# Patient Record
Sex: Male | Born: 1965 | Hispanic: Yes | Marital: Single | Smoking: Never smoker
Health system: Southern US, Community
[De-identification: ages and names within clinical notes are randomized; demographics above are authoritative.]

## PROBLEM LIST (undated history)

## (undated) DIAGNOSIS — A159 Respiratory tuberculosis unspecified: Secondary | ICD-10-CM

---

## 2017-10-26 ENCOUNTER — Encounter (HOSPITAL_COMMUNITY): Payer: Self-pay | Admitting: Emergency Medicine

## 2017-10-26 ENCOUNTER — Emergency Department (HOSPITAL_COMMUNITY)
Admission: EM | Admit: 2017-10-26 | Discharge: 2017-10-26 | Disposition: A | Discharge status: Home / Self Care | Payer: Self-pay | Attending: Emergency Medicine | Admitting: Emergency Medicine

## 2017-10-26 ENCOUNTER — Other Ambulatory Visit: Payer: Self-pay

## 2017-10-26 DIAGNOSIS — M5432 Sciatica, left side: Secondary | ICD-10-CM | POA: Insufficient documentation

## 2017-10-26 HISTORY — DX: Respiratory tuberculosis unspecified: A15.9

## 2017-10-26 MED ORDER — KETOROLAC TROMETHAMINE 60 MG/2ML IM SOLN
30.0000 mg | Freq: Once | INTRAMUSCULAR | Status: AC
  Administered 2017-10-26: 30 mg via INTRAMUSCULAR
  Filled 2017-10-26: qty 2

## 2017-10-26 MED ORDER — PREDNISONE 50 MG PO TABS: ORAL_TABLET | ORAL | 0 refills | Status: AC

## 2017-10-26 MED ORDER — HYDROCODONE-ACETAMINOPHEN 5-325 MG PO TABS
ORAL_TABLET | ORAL | 0 refills | Status: AC
Start: 2017-10-26 — End: ?

## 2017-10-26 NOTE — Discharge Instructions (Addendum)
Take vicodin for breakthrough pain, do not drink alcohol, drive, care for children or do other critical tasks while taking vicodin. ° °Please follow with your primary care doctor in the next 2 days for a check-up. They must obtain records for further management.  ° °Do not hesitate to return to the Emergency Department for any new, worsening or concerning symptoms.  ° °

## 2017-10-26 NOTE — ED Provider Notes (Signed)
[facility]-EMERGENCY DEPT Provider Note   CSN: [id number] Arrival date & time: 10/26/17  0417     History   Chief Complaint Chief Complaint  Patient presents with  . Sciatica     HPI   Blood pressure 121/86, pulse 91, temperature 98.5 F (36.9 C), resp. rate 16, SpO2 96 %.  [facility] David Jenkins is a 52 y.o. male complaining of low back pain radiating down the left leg, worsening over the course of the last several days, kept him from sleep last night.  He is taking acetaminophen at home with little relief. Denies fever, chills, change in bowel or bladder habits, h/o IDVU or cancer, numbness or weakness.    Past Medical History:  Diagnosis Date  . Tuberculosis     There are no active problems to display for this patient.   History reviewed. No pertinent surgical history.     Home Medications    Prior to Admission medications   Medication Sig Start Date End Date Taking? Authorizing Provider  HYDROcodone-acetaminophen (NORCO/VICODIN) 5-325 MG tablet Take 1-2 tablets by mouth every 6 hours as needed for pain and/or cough. 10/26/17   Attikus Bartoszek, Joni Reining, PA-C  predniSONE (DELTASONE) 50 MG tablet Take 1 tablet daily with breakfast 10/26/17   Wanda Rideout, Joni Reining, PA-C    Family History Family History  Problem Relation Age of Onset  . Diabetes Father     Social History Social History   Tobacco Use  . Smoking status: Never Smoker  . Smokeless tobacco: Never Used  Substance Use Topics  . Alcohol use: Yes    Comment: occ   . Drug use: No     Allergies   Patient has no known allergies.   Review of Systems Review of Systems  A complete review of systems was obtained and all systems are negative except as noted in the HPI and PMH.   Physical Exam Updated Vital Signs BP 121/86   Pulse 91   Temp 98.5 F (36.9 C)   Resp 16   SpO2 96%   Physical Exam  Constitutional: He appears well-developed and well-nourished.  HENT:  Head:  Normocephalic.  Eyes: Conjunctivae are normal.  Neck: Normal range of motion.  Cardiovascular: Normal rate, regular rhythm and intact distal pulses.  Pulmonary/Chest: Effort normal.  Abdominal: Soft. There is no tenderness.  Neurological: He is alert.  No point tenderness to percussion of lumbar spinal processes.  No TTP or paraspinal muscular spasm. Strength is 5 out of 5 to bilateral lower extremities at hip and knee; extensor hallucis longus 5 out of 5. Ankle strength 5 out of 5, no clonus, neurovascularly intact. No saddle anaesthesia. Patellar reflexes are 2+ bilaterally.    Straight leg raise is positive on the ipsilateral (left) side at approximately 30 degrees.  Negative on the contralateral side   Psychiatric: He has a normal mood and affect.  Nursing note and vitals reviewed.    ED Treatments / Results  Labs (all labs ordered are listed, but only abnormal results are displayed) Labs Reviewed - No data to display  EKG  EKG Interpretation None       Radiology No results found.  Procedures Procedures (including critical care time)  Medications Ordered in ED Medications  ketorolac (TORADOL) injection 30 mg (30 mg Intramuscular Given 10/26/17 9629)     Initial Impression / Assessment and Plan / ED Course  I have reviewed the triage vital signs and the nursing notes.  Pertinent labs & imaging results  that were available during my care of the patient were reviewed by me and considered in my medical decision making (see chart for details).     Vitals:   10/26/17 0443 10/26/17 0632  BP: 133/85 121/86  Pulse: 90 91  Resp: 18 16  Temp: 98.5 F (36.9 C)   SpO2: 95% 96%    Medications  ketorolac (TORADOL) injection 30 mg (30 mg Intramuscular Given 10/26/17 0635)    David Jenkins is 52 y.o. male presenting with  back pain.  No neurological deficits and normal neuro exam.  Patient can walk but states is painful.  No loss of bowel or bladder control.   No concern for cauda equina.  No fever, night sweats, weight loss, h/o cancer, IVDU.  RICE protocol and pain medicine indicated and discussed with patient.  Evaluation does not show pathology that would require ongoing emergent intervention or inpatient treatment. Pt is hemodynamically stable and mentating appropriately. Discussed findings and plan with patient/guardian, who agrees with care plan. All questions answered. Return precautions discussed and outpatient follow up given.    Final Clinical Impressions(s) / ED Diagnoses   Final diagnoses:  Sciatica of left side    ED Discharge Orders        Ordered    predniSONE (DELTASONE) 50 MG tablet     10/26/17 0618    HYDROcodone-acetaminophen (NORCO/VICODIN) 5-325 MG tablet     10/26/17 0618       Zeven Kocak, Mardella Laymanicole, PA-C 10/26/17 [id number]    Derwood KaplanNanavati, Ankit, MD 10/27/17 [phone]

## 2017-10-26 NOTE — ED Triage Notes (Signed)
Pt states he has pain in his left buttock that started 30 hrs ago  Pt states it feels like nerve pain and he has not been able to sleep due to the pain  Pt states he fell 10 days ago but did not have any pain at that time

## 2021-02-15 ENCOUNTER — Telehealth: Payer: Self-pay | Admitting: Oncology

## 2021-02-15 NOTE — Telephone Encounter (Signed)
Received a new hem referral from [facility] for thrombocytopenia. Mr. David Jenkins has been scheduled to see Dr. Clelia Croft on 5/10 at 11am. Letter has been mailed to the pt and referring office has been notified of the appt date and time.

## 2021-02-26 ENCOUNTER — Inpatient Hospital Stay: Payer: 59 | Attending: Oncology | Admitting: Oncology

## 2021-04-08 ENCOUNTER — Telehealth: Payer: Self-pay | Admitting: Hematology

## 2021-04-08 NOTE — Telephone Encounter (Signed)
Received a new pt referral from [facility]. Pt has been reschedule to see Dr. Candise Che on 6/29 at 11:20am. [name] from [facility] will call the pt with the appt date and time.

## 2021-04-15 ENCOUNTER — Telehealth: Payer: Self-pay | Admitting: Hematology

## 2021-04-15 NOTE — Telephone Encounter (Signed)
Mr. David Jenkins has been rescheduled to see dr. Candise Che on 7/7 at 11am. I cld [name] at [facility] to update the pt w/the new date and time.

## 2021-04-17 ENCOUNTER — Encounter: Payer: 59 | Admitting: Hematology

## 2021-04-23 ENCOUNTER — Telehealth: Payer: Self-pay | Admitting: Hematology

## 2021-04-23 NOTE — Telephone Encounter (Signed)
Mr. David Jenkins has been rescheduled to see Dr. Candise Che on 7/13 at 1140am. I cld [name] from Dr Brandt Loosen office and provided the new appt date and time.

## 2021-04-25 ENCOUNTER — Inpatient Hospital Stay: Payer: 59 | Admitting: Hematology

## 2021-04-30 NOTE — Progress Notes (Signed)
HEMATOLOGY/ONCOLOGY CONSULTATION NOTE  Date of Service: 05/01/2021  Patient Care Team: Patient, No Pcp Per (Inactive) as PCP - General (General Practice)  CHIEF COMPLAINTS/PURPOSE OF CONSULTATION:  Thrombocytopenia  HISTORY OF PRESENTING ILLNESS:   David Jenkins is a wonderful 55 y.o. male who has been referred to [place] by Dr. Effie Shy, MD for evaluation and management of thrombocytopenia.The pt reports that he is doing well overall. We are joined today by the interpretor.  The pt reports that he is doing well. He notes that he is here due to a lump found in his neck. He first noticed an extremely small lump the size of a pimple around 12-13 years ago. This has gradually been increasing and slowly growing over this period. This does not hurt. Sometimes he squeezes it and it oozes with liquid that smells bad.   The pt notes that he is unaware of any abnormal lab readings prior in the past. He had tuberculosis in 2012/2013, but the pt notes he has been very healthy since then. He is currently not on any medications or any ongoing medical issues. He denies any surgeries in the past. He notes no allergies to medications. He does not take any OTC NSAIDs, but does take a daily Collagen and Omega 3.  He has taken a daily Centrum and Magnesium in the past. He denies any testosterone replacement or weight loss supplements. He denies any recent infections in the last six months. He drinks around 3-4 bottles water each day. He denies any history of cigarette or cigar smoking. He drinks beer and whiskey socially, around 0.5L whiskey when he drinks. At the pt's peak drinking, he would drink this amount around every other day. The pt denies any history of drug use. 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 This was in [place] at [facility].  The pt notes that  the blood in urine has resolved since he saw his PCP. He is unsure of what caused this and they told him everything was fine. They have not done the [place] yet as planned. He was urinating normally at the time, just with some blood separately after. He denies any injuries during sex or masturbation. He did not pass any stones.   Lab results 02/13/2021 of CBC w/diff and CMP is as follows: all values are WNL except for WBC of 3.6K, RBC of 5.81, Hgb of 17.2, HCT of 51.4, Plt of 89K, MPV of 13.4. 02/13/2021 Vitamin B12 of 544.  On review of systems, pt denies fevers, chills, drenching night sweats, unexpected sudden weight loss, changes in bowel habits, n/v/d, blood in urine, difficulty passing urine, abdominal pain, and any other symptoms.   MEDICAL HISTORY:  Past Medical History:  Diagnosis Date   Tuberculosis     SURGICAL HISTORY: No past surgical history on file.  SOCIAL HISTORY: Social History   Socioeconomic History   Marital status: Single    Spouse name: Not on file   Number of children: Not on file   Years of education: Not on file   Highest education level: Not on file  Occupational History   Not on file  Tobacco Use   Smoking status: Never   Smokeless tobacco: Never  Substance and Sexual Activity   Alcohol use: Yes    Comment: occ    Drug use: No  Sexual activity: Not on file  Other Topics Concern   Not on file  Social History Narrative   Not on file   Social Determinants of Health   Financial Resource Strain: Not on file  Food Insecurity: Not on file  Transportation Needs: Not on file  Physical Activity: Not on file  Stress: Not on file  Social Connections: Not on file  Intimate Partner Violence: Not on file    FAMILY HISTORY: Family History  Problem Relation Age of Onset   Diabetes Father     ALLERGIES:  has No Known Allergies.  MEDICATIONS:  Current Outpatient Medications  Medication Sig Dispense Refill   omega-3 acid ethyl esters (LOVAZA) 1 g  capsule Take 1 g by mouth 2 (two) times daily.     HYDROcodone-acetaminophen (NORCO/VICODIN) 5-325 MG tablet Take 1-2 tablets by mouth every 6 hours as needed for pain and/or cough. (Patient not taking: Reported on 05/01/2021) 7 tablet 0   predniSONE (DELTASONE) 50 MG tablet Take 1 tablet daily with breakfast (Patient not taking: Reported on 05/01/2021) 5 tablet 0   No current facility-administered medications for this visit.    REVIEW OF SYSTEMS:    10 Point review of Systems was done is negative except as noted above.  PHYSICAL EXAMINATION: ECOG PERFORMANCE STATUS: 1 - Symptomatic but completely ambulatory  . Vitals:   05/01/21 1119  BP: 135/89  Pulse: 85  Resp: 16  Temp: 97.8 F (36.6 C)  SpO2: 98%   Filed Weights   05/01/21 1119  Weight: 195 lb 9.6 oz (88.7 kg)   .Body mass index is 26.9 kg/m.  NAD. GENERAL:alert, in no acute distress and comfortable SKIN: no acute rashes, no significant lesions EYES: conjunctiva are pink and non-injected, sclera anicteric OROPHARYNX: MMM, no exudates, no oropharyngeal erythema or ulceration NECK: supple, no JVD LYMPH:  no palpable lymphadenopathy in the cervical, axillary or inguinal regions LUNGS: clear to auscultation b/l with normal respiratory effort HEART: regular rate & rhythm ABDOMEN:  normoactive bowel sounds , non tender, not distended. Extremity: no pedal edema PSYCH: alert & oriented x 3 with fluent speech NEURO: no focal motor/sensory deficits  LABORATORY DATA:  I have reviewed the data as listed  .No flowsheet data found.  .No flowsheet data found.   RADIOGRAPHIC STUDIES: I have personally reviewed the radiological images as listed and agreed with the findings in the report. No results found.  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 R/o other etiologies. 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  PLAN: -Discussed pt recent labwork; RBC and Hgb elevated, Plt lower.  -Advised pt the lump is most likely a sebaceous cyst. Would need to take the cyst out to completely get rid of this.  -Will give referral to surgeon for removal of sebaceous cyst.  -Advised pt that dehydration can cause some of the elevation in RBC/Hgb on labwork. Advised pt he needs to drink 3-4 glasses water for every glass of alcohol he drinks. -Recommended pt drink at lest 2-3L water daily. -Will look at additional blood tests to observe if any changes in the bone marrow causing these abnormalities. -Discussed Polycythemia Vera. Advised pt this is what we are trying to rule out. We would typically expect plt to be higher, not lower-- thus this is not expected. -Recommended pt start Vitamin B-Complex daily. -Will get blood tests today. -  Will get [place] Abd in 1-2 weeks. -Will see back in 2-3 weeks. The pt needs Wednesday appointments due to work.    FOLLOW UP: Labs today [place] abd in 1 week RTC with Dr Candise Che in 2-3 week (patient prefers Wednesday appointment)    All of the patients questions were answered with apparent satisfaction. The patient knows to call the clinic with any problems, questions or concerns.  I spent 60 minutes counseling the patient face to face. The total time spent in the appointment was 75 minutes and more than 50% was on counseling and direct patient cares.    Wyvonnia Lora MD MS AAHIVMS [facility] Hematology/Oncology Physician [facility]  (Office):       [phone] (Work cell):  [phone] (Fax):           [phone]  05/01/2021 12:34 PM   I, Minda Meo, am acting as scribe for Dr. Wyvonnia Lora, MD.   .I have reviewed the above documentation for accuracy and completeness, and I agree with the above. Johney Maine MD

## 2021-05-01 ENCOUNTER — Inpatient Hospital Stay: Payer: 59 | Attending: Hematology | Admitting: Hematology

## 2021-05-01 ENCOUNTER — Inpatient Hospital Stay: Payer: 59

## 2021-05-01 ENCOUNTER — Other Ambulatory Visit: Payer: Self-pay

## 2021-05-01 VITALS — BP 135/89 | HR 85 | Temp 97.8°F | Resp 16 | Ht 71.5 in | Wt 195.6 lb

## 2021-05-01 DIAGNOSIS — Z79899 Other long term (current) drug therapy: Secondary | ICD-10-CM | POA: Diagnosis not present

## 2021-05-01 DIAGNOSIS — L723 Sebaceous cyst: Secondary | ICD-10-CM | POA: Insufficient documentation

## 2021-05-01 DIAGNOSIS — D751 Secondary polycythemia: Secondary | ICD-10-CM | POA: Diagnosis not present

## 2021-05-01 DIAGNOSIS — D696 Thrombocytopenia, unspecified: Secondary | ICD-10-CM

## 2021-05-01 DIAGNOSIS — Z7952 Long term (current) use of systemic steroids: Secondary | ICD-10-CM | POA: Diagnosis not present

## 2021-05-01 LAB — CBC WITH DIFFERENTIAL (CANCER CENTER ONLY)
Abs Immature Granulocytes: 0.01 10*3/uL (ref 0.00–0.07)
Basophils Absolute: 0 10*3/uL (ref 0.0–0.1)
Basophils Relative: 0 %
Eosinophils Absolute: 0 10*3/uL (ref 0.0–0.5)
Eosinophils Relative: 1 %
HCT: 48.1 % (ref 39.0–52.0)
Hemoglobin: 16.3 g/dL (ref 13.0–17.0)
Immature Granulocytes: 0 %
Lymphocytes Relative: 37 %
Lymphs Abs: 1.1 10*3/uL (ref 0.7–4.0)
MCH: 30.6 pg (ref 26.0–34.0)
MCHC: 33.9 g/dL (ref 30.0–36.0)
MCV: 90.2 fL (ref 80.0–100.0)
Monocytes Absolute: 0.3 10*3/uL (ref 0.1–1.0)
Monocytes Relative: 10 %
Neutro Abs: 1.5 10*3/uL — ABNORMAL LOW (ref 1.7–7.7)
Neutrophils Relative %: 52 %
Platelet Count: 81 10*3/uL — ABNORMAL LOW (ref 150–400)
RBC: 5.33 MIL/uL (ref 4.22–5.81)
RDW: 13.2 % (ref 11.5–15.5)
WBC Count: 2.9 10*3/uL — ABNORMAL LOW (ref 4.0–10.5)
nRBC: 0 % (ref 0.0–0.2)

## 2021-05-01 LAB — HEPATITIS C ANTIBODY: HCV Ab: NONREACTIVE

## 2021-05-01 LAB — CMP (CANCER CENTER ONLY)
ALT: 33 U/L (ref 0–44)
AST: 20 U/L (ref 15–41)
Albumin: 3.9 g/dL (ref 3.5–5.0)
Alkaline Phosphatase: 96 U/L (ref 38–126)
Anion gap: 9 (ref 5–15)
BUN: 18 mg/dL (ref 6–20)
CO2: 27 mmol/L (ref 22–32)
Calcium: 9.1 mg/dL (ref 8.9–10.3)
Chloride: 108 mmol/L (ref 98–111)
Creatinine: 1.26 mg/dL — ABNORMAL HIGH (ref 0.61–1.24)
GFR, Estimated: >60 mL/min (ref >60)
Glucose, Bld: 86 mg/dL (ref 70–99)
Potassium: 4.4 mmol/L (ref 3.5–5.1)
Sodium: 144 mmol/L (ref 135–145)
Total Bilirubin: 0.5 mg/dL (ref 0.3–1.2)
Total Protein: 7.9 g/dL (ref 6.5–8.1)

## 2021-05-01 LAB — HIV ANTIBODY (ROUTINE TESTING W REFLEX): HIV Screen 4th Generation wRfx: NONREACTIVE

## 2021-05-01 LAB — IMMATURE PLATELET FRACTION: Immature Platelet Fraction: 15.5 % — ABNORMAL HIGH (ref 1.2–8.6)

## 2021-05-07 LAB — JAK2 (INCLUDING V617F AND EXON 12), MPL,& CALR-NEXT GEN SEQ

## 2021-05-14 ENCOUNTER — Ambulatory Visit (HOSPITAL_COMMUNITY)
Admission: RE | Admit: 2021-05-14 | Discharge: 2021-05-14 | Disposition: A | Discharge status: Home / Self Care | Payer: 59 | Source: Ambulatory Visit | Attending: Hematology | Admitting: Hematology

## 2021-05-14 ENCOUNTER — Other Ambulatory Visit: Payer: Self-pay

## 2021-05-14 DIAGNOSIS — D696 Thrombocytopenia, unspecified: Secondary | ICD-10-CM | POA: Insufficient documentation

## 2021-05-14 DIAGNOSIS — D751 Secondary polycythemia: Secondary | ICD-10-CM | POA: Insufficient documentation

## 2021-05-15 ENCOUNTER — Inpatient Hospital Stay: Payer: 59 | Admitting: Hematology

## 2021-05-15 ENCOUNTER — Other Ambulatory Visit: Payer: Self-pay | Admitting: Hematology

## 2021-05-15 NOTE — Progress Notes (Signed)
Patient no showed to his appointment today  [place] abd- results concerning -  "Splenomegaly with multiple hypoechoic splenic masses. Additional hypoechoic masslike structure noted adjacent to the spleen. Findings may be the result of infection, metastatic disease, or hematologic malignancy such as lymphoma/leukemia. Further evaluation with contrast enhanced CT of the chest, abdomen, and pelvis would be beneficial for staging. These results will be called to the ordering clinician or representative by the Radiologist Assistant, and communication documented in the PACS or [facility] Dashboard."  We are active trying to get in touch with patient to reschedule his missed appointment. He will need CT chest/abd/pelvis to further evaluate these findings r/o malignancy.  Wyvonnia Lora MD MS

## 2022-06-23 IMAGING — US US ABDOMEN COMPLETE
1 series · 15 of 25 positions shown · non-contrast
Comparison: None.

CLINICAL DATA: Polycythemia

Thrombocytopenia
EXAM:
ABDOMEN ULTRASOUND COMPLETE

[Series 1: us abdomen complete mc & wl · 74 acquisitions, 15 frames shown]
[im 1/74]
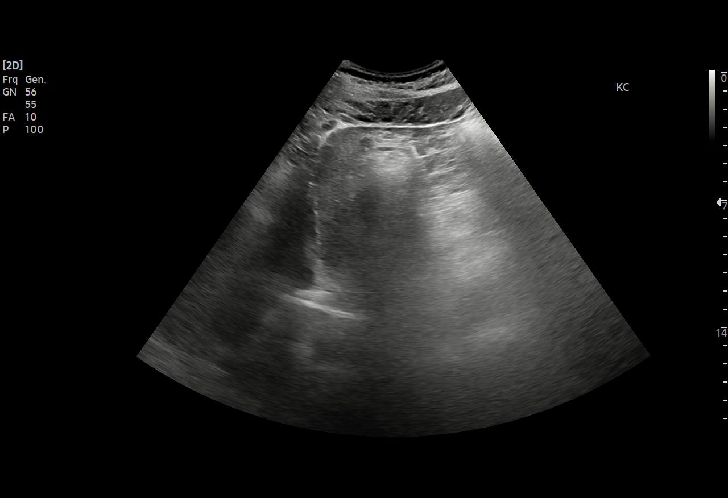
[im 7/74]
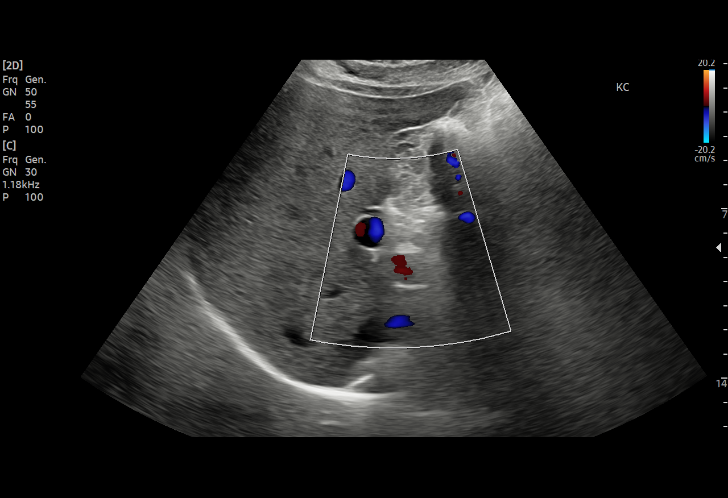
[im 13/74]
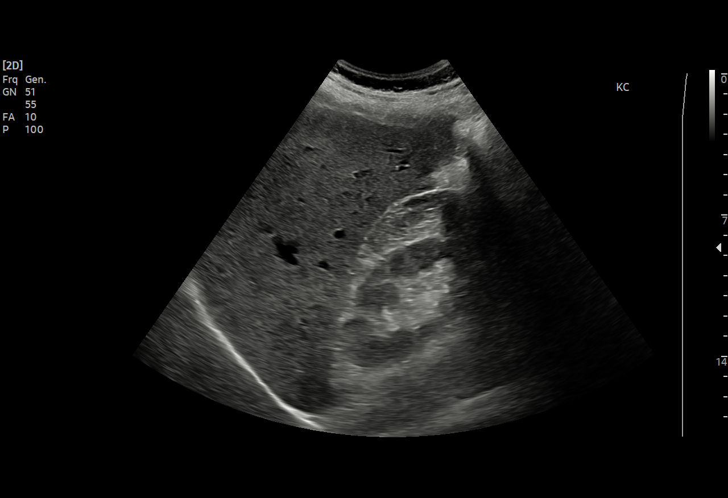
[im 16/74]
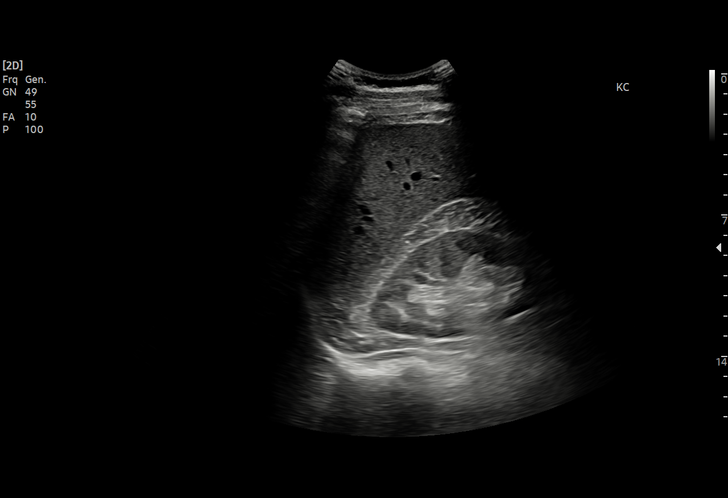
[im 22/74]
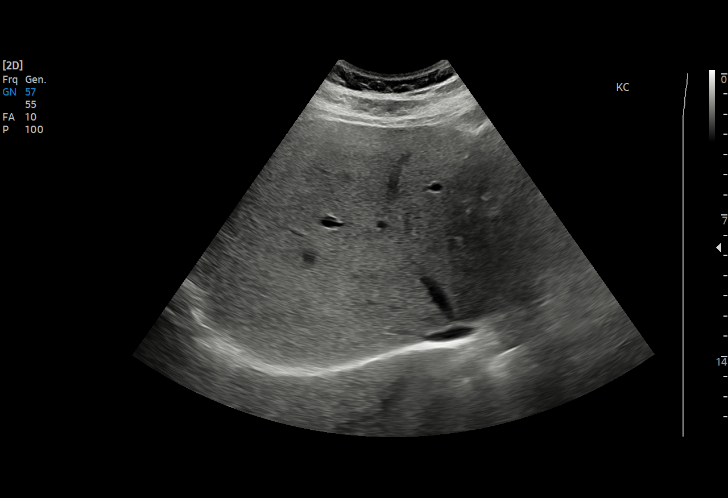
[im 28/74]
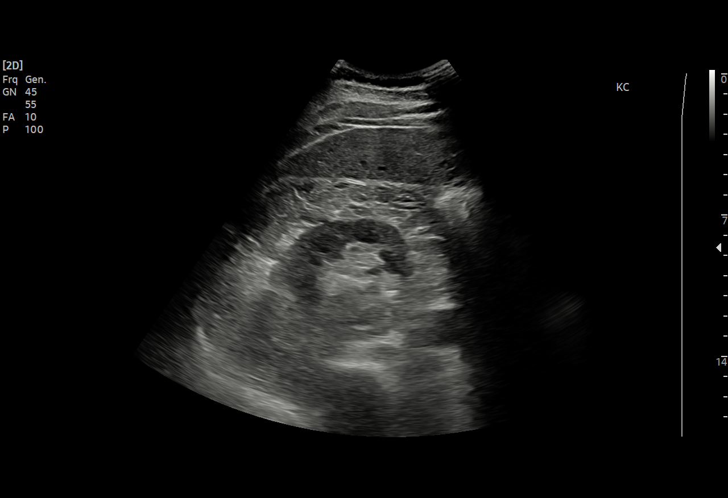
[im 31/74]
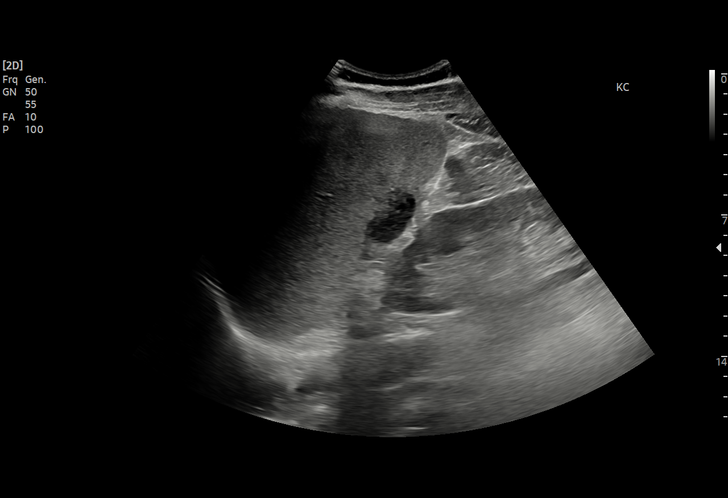
[im 37/74]
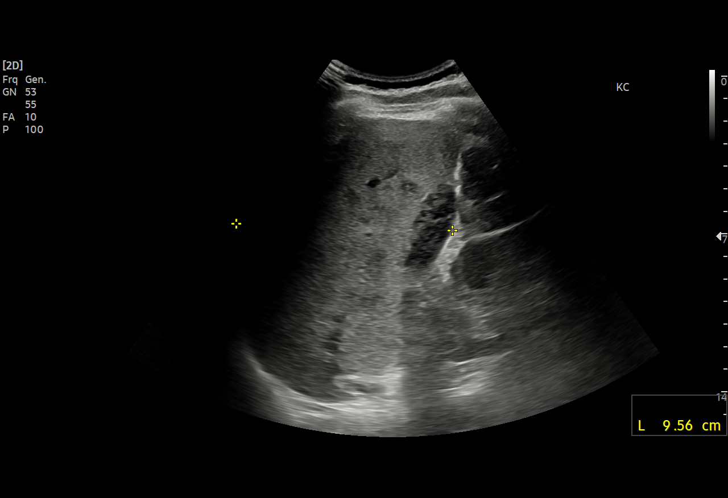
[im 43/74]
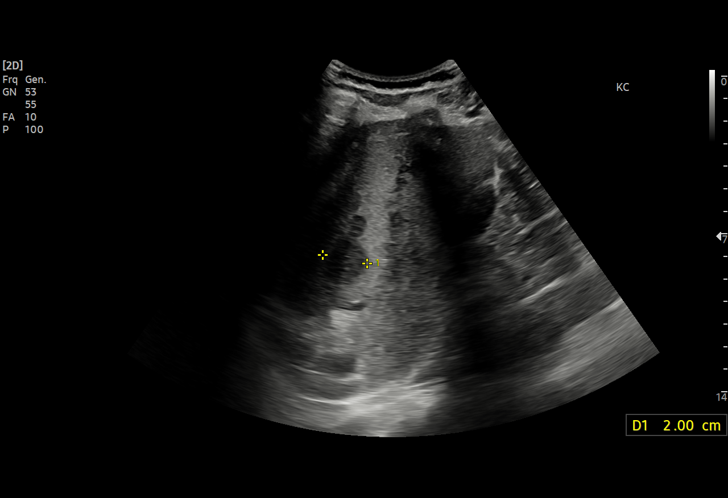
[im 46/74]
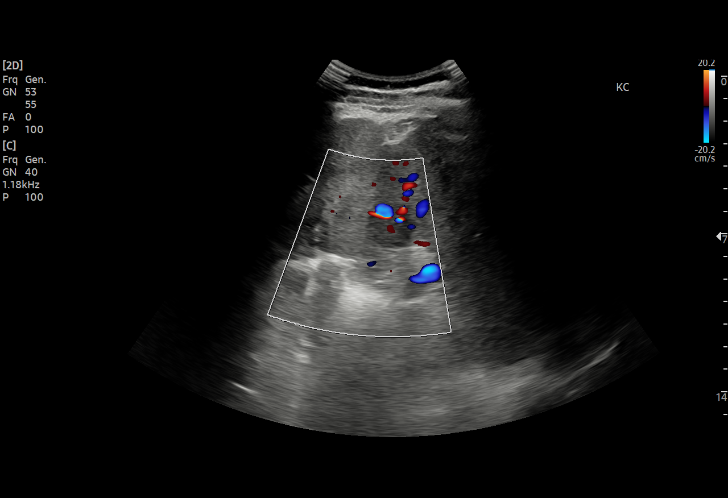
[im 52/74]
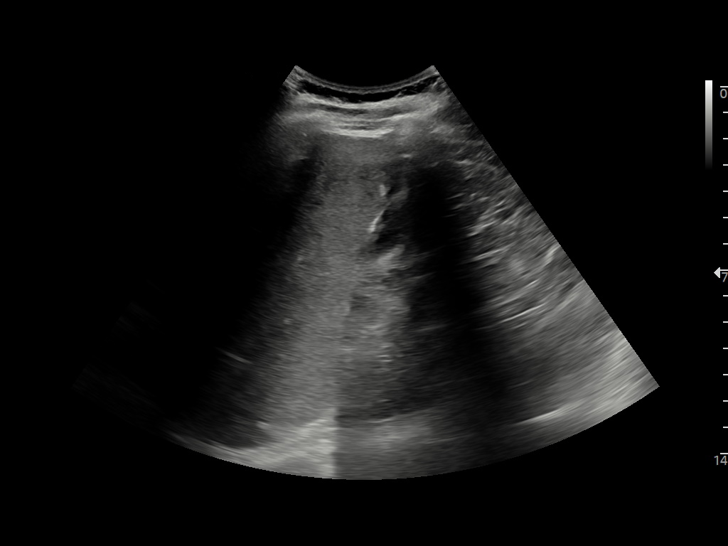
[im 58/74]
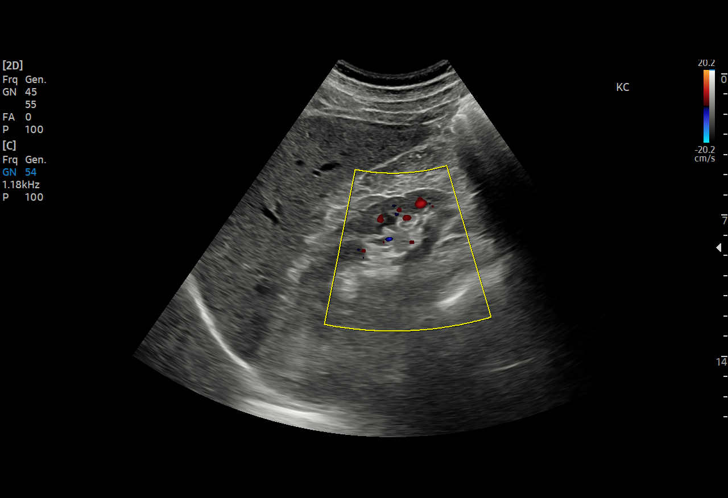
[im 61/74]
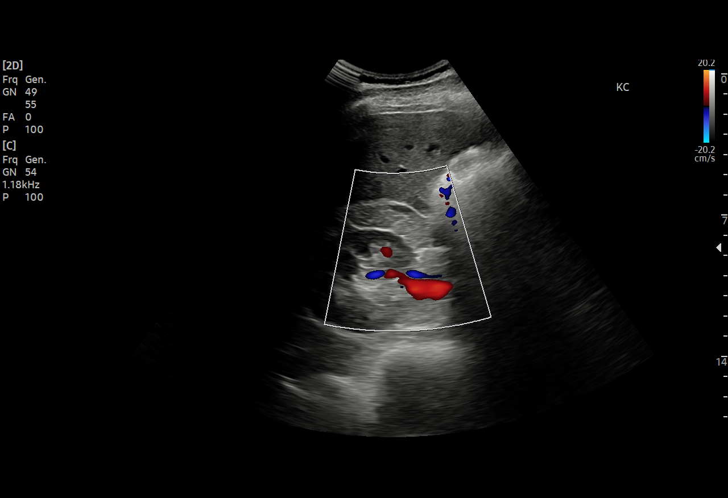
[im 67/74]
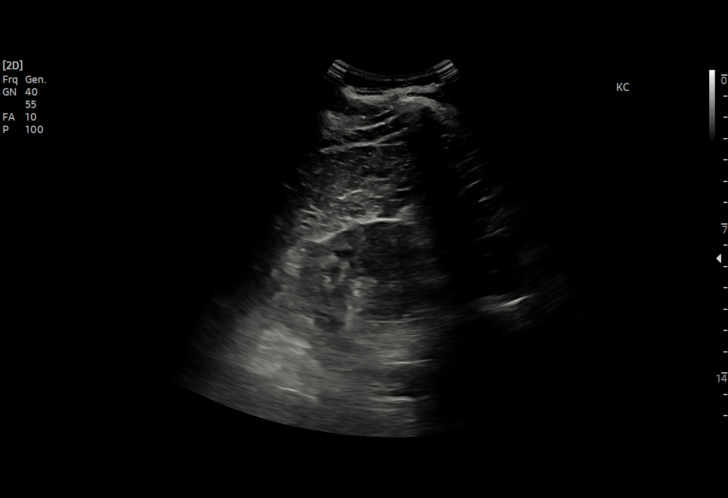
[im 74/74]
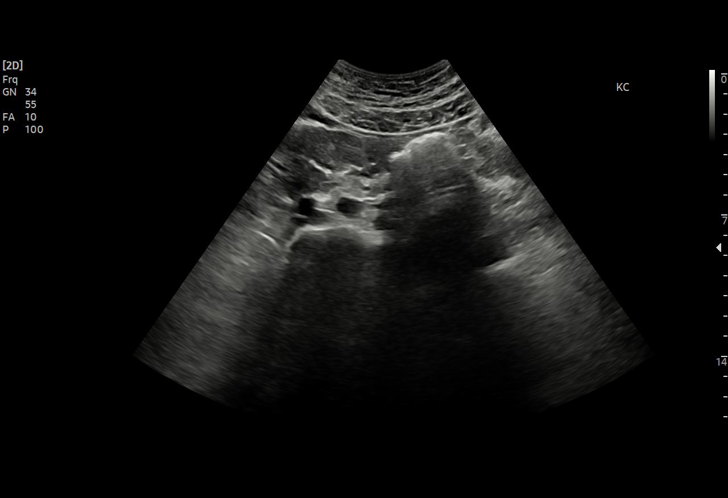

[15 of 25 positions shown; findings below may reference images not displayed]

FINDINGS: Gallbladder: Not visualized.  Possible prior cholecystectomy.

Common bile duct: Diameter: 4 mm

Liver: No focal lesion identified. Within normal limits in
parenchymal echogenicity. Portal vein is patent on color Doppler
imaging with normal direction of blood flow towards the liver.

IVC: No abnormality visualized.

Pancreas: Obscured by shadowing bowel gas.

Spleen: Mildly enlarged measuring 14.511.1 x 7.6 cm. Multiple
hypoechoic masses are present with the largest measuring 2.7 x 1.6 x
2.0 cm. Additional hypoechoic mass noted between the spleen and left
kidney measuring 5.6 x 1.7 x 2.7 cm.

Right Kidney: Length: 12.0 cm. Echogenicity within normal limits. No
mass or hydronephrosis visualized.

Left Kidney: Length: 11.5 cm. Echogenicity within normal limits. No
mass or hydronephrosis visualized.

Abdominal aorta: No aneurysm visualized.

Other findings: None.
IMPRESSION: Splenomegaly with multiple hypoechoic splenic masses. Additional
hypoechoic masslike structure noted adjacent to the spleen. Findings
may be the result of infection, metastatic disease, or hematologic
malignancy such as lymphoma/leukemia. Further evaluation with
contrast enhanced CT of the chest, abdomen, and pelvis would be
beneficial for staging.

These results will be called to the ordering clinician or
representative by the Radiologist Assistant, and communication
documented in the PACS or [REDACTED].
